# Patient Record
Sex: Male | Born: 1980 | Race: White | Hispanic: No | Marital: Single | Smoking: Current every day smoker
Health system: Southern US, Community
[De-identification: ages and names within clinical notes are randomized; demographics above are authoritative.]

## PROBLEM LIST (undated history)

## (undated) HISTORY — PX: WISDOM TOOTH EXTRACTION: SHX21

---

## 2014-02-20 ENCOUNTER — Ambulatory Visit (INDEPENDENT_AMBULATORY_CARE_PROVIDER_SITE_OTHER): Payer: BLUE CROSS/BLUE SHIELD

## 2014-02-20 ENCOUNTER — Ambulatory Visit (INDEPENDENT_AMBULATORY_CARE_PROVIDER_SITE_OTHER): Payer: BLUE CROSS/BLUE SHIELD | Admitting: Family Medicine

## 2014-02-20 VITALS — BP 128/78 | HR 96 | Temp 98.5°F | Resp 18 | Ht 73.0 in | Wt 180.0 lb

## 2014-02-20 DIAGNOSIS — J329 Chronic sinusitis, unspecified: Secondary | ICD-10-CM

## 2014-02-20 DIAGNOSIS — F17200 Nicotine dependence, unspecified, uncomplicated: Secondary | ICD-10-CM

## 2014-02-20 DIAGNOSIS — Z720 Tobacco use: Secondary | ICD-10-CM

## 2014-02-20 DIAGNOSIS — R0781 Pleurodynia: Secondary | ICD-10-CM

## 2014-02-20 MED ORDER — AZITHROMYCIN 250 MG PO TABS: ORAL_TABLET | ORAL | Status: DC

## 2014-02-20 NOTE — Patient Instructions (Signed)
Drink plenty of fluids to stay well hydrated and keep the mucus thin  Take the azithromycin 2 pills initially, then 1 daily for 4 days  Take OTC ibuprofen 800 mg (4200) maximum of 3 times daily for pain and inflammation of chest wall  Return if worse, particularly if you start running a fever or getting short of breath or having more intense pain

## 2014-02-20 NOTE — Progress Notes (Signed)
Subjective: 34 year old man who has a history of a respiratory infection for the past week or so. He's been coughing a lot. Last night he coughed very intensively to try and bring up some phlegm. Since it has been hurting in his right rib cage. He has been hurting since then. This recommended that he come see the doctor. He does smoke. 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 Moderate amount of cerumen in both ears. Throat clear. Neck supple without significant nodes. Chest clear to auscultation. Heart regular without murmurs. Tenderness right lateral chest wall at about the 8-9th rib anterior axillary line  Assessment: Chest wall pain Bronchitis Tobacco use disorder  Plan: Chest rib films  [facility] reading (PRIMARY) by  Dr. Alwyn RenHopper Normal rib series  Azithromycin and ibuprofen  .

## 2015-12-20 ENCOUNTER — Emergency Department (HOSPITAL_COMMUNITY): Payer: BLUE CROSS/BLUE SHIELD

## 2015-12-20 ENCOUNTER — Emergency Department (HOSPITAL_COMMUNITY)
Admission: EM | Admit: 2015-12-20 | Discharge: 2015-12-20 | Disposition: A | Discharge status: Home / Self Care | Payer: BLUE CROSS/BLUE SHIELD | Attending: Emergency Medicine | Admitting: Emergency Medicine

## 2015-12-20 ENCOUNTER — Encounter (HOSPITAL_COMMUNITY): Payer: Self-pay | Admitting: Emergency Medicine

## 2015-12-20 DIAGNOSIS — J029 Acute pharyngitis, unspecified: Secondary | ICD-10-CM | POA: Diagnosis present

## 2015-12-20 DIAGNOSIS — F17210 Nicotine dependence, cigarettes, uncomplicated: Secondary | ICD-10-CM | POA: Insufficient documentation

## 2015-12-20 DIAGNOSIS — J36 Peritonsillar abscess: Secondary | ICD-10-CM | POA: Insufficient documentation

## 2015-12-20 LAB — BASIC METABOLIC PANEL
Anion gap: 11 (ref 5–15)
BUN: 8 mg/dL (ref 6–20)
CALCIUM: 9.3 mg/dL (ref 8.9–10.3)
CO2: 24 mmol/L (ref 22–32)
CREATININE: 1.04 mg/dL (ref 0.61–1.24)
Chloride: 100 mmol/L — ABNORMAL LOW (ref 101–111)
GFR calc non Af Amer: >60 mL/min (ref >60)
Glucose, Bld: 94 mg/dL (ref 65–99)
Potassium: 3.7 mmol/L (ref 3.5–5.1)
SODIUM: 135 mmol/L (ref 135–145)

## 2015-12-20 LAB — CBC WITH DIFFERENTIAL/PLATELET
Basophils Absolute: 0 10*3/uL (ref 0.0–0.1)
Basophils Relative: 0 %
Eosinophils Absolute: 0 10*3/uL (ref 0.0–0.7)
Eosinophils Relative: 0 %
HCT: 47.4 % (ref 39.0–52.0)
HEMOGLOBIN: 16.2 g/dL (ref 13.0–17.0)
Lymphocytes Relative: 11 %
Lymphs Abs: 1.6 10*3/uL (ref 0.7–4.0)
MCH: 31.2 pg (ref 26.0–34.0)
MCHC: 34.2 g/dL (ref 30.0–36.0)
MCV: 91.2 fL (ref 78.0–100.0)
MONOS PCT: 11 %
Monocytes Absolute: 1.6 10*3/uL — ABNORMAL HIGH (ref 0.1–1.0)
Neutro Abs: 11.6 10*3/uL — ABNORMAL HIGH (ref 1.7–7.7)
Neutrophils Relative %: 78 %
Platelets: 292 10*3/uL (ref 150–400)
RBC: 5.2 MIL/uL (ref 4.22–5.81)
RDW: 13.5 % (ref 11.5–15.5)
WBC: 14.9 10*3/uL — AB (ref 4.0–10.5)

## 2015-12-20 LAB — I-STAT CG4 LACTIC ACID, ED: Lactic Acid, Venous: 0.93 mmol/L (ref 0.5–1.9)

## 2015-12-20 MED ORDER — SODIUM CHLORIDE 0.9 % IV BOLUS (SEPSIS)
500.0000 mL | Freq: Once | INTRAVENOUS | Status: AC
Start: 2015-12-20 — End: 2015-12-20
  Administered 2015-12-20: 500 mL via INTRAVENOUS

## 2015-12-20 MED ORDER — MORPHINE SULFATE (PF) 2 MG/ML IV SOLN
4.0000 mg | Freq: Once | INTRAVENOUS | Status: AC
  Administered 2015-12-20: 4 mg via INTRAVENOUS
  Filled 2015-12-20: qty 2

## 2015-12-20 MED ORDER — DEXAMETHASONE SODIUM PHOSPHATE 10 MG/ML IJ SOLN: 10.0000 mg | Freq: Once | INTRAMUSCULAR | Status: DC

## 2015-12-20 MED ORDER — HYDROCODONE-ACETAMINOPHEN 5-325 MG PO TABS: 1.0000 | ORAL_TABLET | Freq: Four times a day (QID) | ORAL | 0 refills | Status: AC | PRN

## 2015-12-20 MED ORDER — DEXAMETHASONE SODIUM PHOSPHATE 10 MG/ML IJ SOLN
10.0000 mg | Freq: Once | INTRAMUSCULAR | Status: AC
  Administered 2015-12-20: 10 mg via INTRAVENOUS
  Filled 2015-12-20: qty 1

## 2015-12-20 MED ORDER — IOPAMIDOL (ISOVUE-300) INJECTION 61%
100.0000 mL | Freq: Once | INTRAVENOUS | Status: AC | PRN
  Administered 2015-12-20: 100 mL via INTRAVENOUS

## 2015-12-20 MED ORDER — CLINDAMYCIN HCL 150 MG PO CAPS: 300.0000 mg | ORAL_CAPSULE | Freq: Three times a day (TID) | ORAL | 0 refills | Status: AC

## 2015-12-20 MED ORDER — LIDOCAINE-EPINEPHRINE 1 %-1:100000 IJ SOLN
30.0000 mL | Freq: Once | INTRAMUSCULAR | Status: AC
  Administered 2015-12-20: 30 mL via INTRADERMAL
  Filled 2015-12-20: qty 30

## 2015-12-20 NOTE — ED Notes (Signed)
Bed: [id number] Expected date:  Expected time:  Means of arrival:  Comments: tr5

## 2015-12-20 NOTE — Discharge Instructions (Signed)
Please follow-up with Dr. Pollyann Kennedyosen as instructed.Please take your antibiotics 3 times a day for 10 days. You may take the pain medicine as needed. He may also take Motrin as needed.Please return to the ED if your symptoms worsen. Or develop new signs of infection.

## 2015-12-20 NOTE — ED Triage Notes (Signed)
Pt reports sore throat x 5 days, increased difficulty and pain swallowing x 24 hours. Stated that he can swallow water.Seen 3 days ago and sore throat. This morning pt was [facility] by [facility], labs drawn, "antibiotic shot given" and pt was referred to ED for possible abscess in throat. Pt is alert, oriented, and conversive

## 2015-12-20 NOTE — ED Notes (Signed)
Patient transported to CT 

## 2015-12-20 NOTE — ED Provider Notes (Signed)
[facility]-EMERGENCY DEPT Provider Note   CSN: [id number] Arrival date & time: 12/20/15  1109  By signing my name below, I, Rosario AdieWilliam Andrew Hiatt, attest that this documentation has been prepared under the direction and in the presence of Demetrios LollKenneth Leaphart, PA-C.  Electronically Signed: Rosario AdieWilliam Andrew Hiatt, ED Scribe. 12/20/15. 12:16 PM.  History   Chief Complaint Chief Complaint  Patient presents with  . Sore Throat    x 5 days  . Dysphagia    increased over last few days  . Abscess    Sent from PCP to r/o retropharyngial abscess   The history is provided by the patient. No language interpreter was used.   HPI Comments: Jay Christensen is a 35 y.o. male with no pertinent PMHx, who presents to the Emergency Department complaining of gradually worsening sore throat onset approximately 5 days ago. He notes that his pain is more localized to the left-side of his throat. Pt reports associated difficulty swallowing, subjective fever, left ear pain, and muffled voice changes secondary to his sore throat. Pt was seen for same approximately 4 days ago [facility] where they swabbed for strep which was resulted as negative. Pt was again evaluated today by [facility] prior to his arrival in the ED. At that time basic lab work revealed an elevated white count for which he was given both Toradol and Rocephin injections, and subsequent referral into the ED to r/o possible retropharyngeal abscess. He states that after both of these treatments en clinic that his pain and swelling has been mildly alleviated. He has additionally been taking Ibuprofen, using salt water gargles, and Chloraseptic sprays at home with minimal relief of his current symptoms. Pt currently is able to tolerate his own secretions well, however, states that his pain is exacerbated with swallowing. He denies chest pain, shortness of breath, difficulty breathing, or any other associated symptoms.   History reviewed. No  pertinent past medical history.  There are no active problems to display for this patient.  Past Surgical History:  Procedure Laterality Date  . WISDOM TOOTH EXTRACTION      Home Medications    Prior to Admission medications   Medication Sig Start Date End Date Taking? Authorizing Provider  azithromycin (ZITHROMAX) 250 MG tablet Take 2 pills initially, then 1 daily for 4 days 02/20/14   Peyton Najjaravid H Hopper, MD  ibuprofen (ADVIL,MOTRIN) 200 MG tablet Take 200 mg by mouth every 6 (six) hours as needed.    Historical Provider, MD   Family History Family History  Problem Relation Age of Onset  . Hypertension Father   . Hyperlipidemia Father    Social History Social History  Substance Use Topics  . Smoking status: Current Every Day Smoker    Packs/day: 0.50    Years: 12.00    Types: Cigarettes  . Smokeless tobacco: Never Used  . Alcohol use 0.0 oz/week     Comment: weekly   Allergies   Patient has no known allergies.  Review of Systems Review of Systems  Constitutional: Positive for fever (subjective).  HENT: Positive for ear pain (left), sore throat, trouble swallowing and voice change.   Respiratory: Negative for shortness of breath.   Cardiovascular: Negative for chest pain.  All other systems reviewed and are negative.  Physical Exam Updated Vital Signs BP 129/93   Pulse 105   Temp 99.1 F (37.3 C)   Resp 21   Wt 91.7 kg   SpO2 97%   BMI 26.67 kg/m  Physical Exam  Constitutional: He appears well-developed and well-nourished.  Pt is speaking in complete sentences with a muffled voice. Able to manage secretions at this time.   HENT:  Head: Normocephalic and atraumatic.  Right Ear: Tympanic membrane, external ear and ear canal normal.  Left Ear: Tympanic membrane, external ear and ear canal normal.  Nose: Nose normal.  Mouth/Throat: Mucous membranes are normal. There is trismus in the jaw. Posterior oropharyngeal edema, posterior oropharyngeal erythema and  tonsillar abscesses present. No oropharyngeal exudate. Tonsils are 2+ on the right. Tonsils are 4+ on the left. No tonsillar exudate.  Edema noted to left tonsillar region. There is uvular deviation to the right. Significant amount of erythema without exudates. Trismus is present.   Eyes: Conjunctivae are normal.  Neck:  Tender cervical lymphadenopathy on the left. Limited ROM due to pain.   Cardiovascular: Regular rhythm, normal heart sounds and intact distal pulses.  Tachycardia present.   Pulmonary/Chest: Effort normal and breath sounds normal. No respiratory distress. He has no wheezes. He has no rales.  Lungs CTA bilaterally. No hypoxia. Speaking in complete sentences.    Abdominal: He exhibits no distension.  Musculoskeletal: Normal range of motion.  Lymphadenopathy:    He has cervical adenopathy.  Neurological: He is alert.  Skin: Skin is warm and dry. Capillary refill takes less than 2 seconds.  Psychiatric: He has a normal mood and affect. His behavior is normal.  Nursing note and vitals reviewed.  ED Treatments / Results  DIAGNOSTIC STUDIES: Oxygen Saturation is 98% on RA, normal by my interpretation.   COORDINATION OF CARE: 12:16 PM-Discussed next steps with pt. Pt verbalized understanding and is agreeable with the plan.   Labs (all labs ordered are listed, but only abnormal results are displayed) Labs Reviewed  BASIC METABOLIC PANEL  CBC WITH DIFFERENTIAL/PLATELET  I-STAT CG4 LACTIC ACID, ED   Radiology Ct Soft Tissue Neck W Contrast  Result Date: 12/20/2015 CLINICAL DATA:  Worsening sore throat over 4 days, greatest scratch sec greater to the left. Evaluation for retropharyngeal abscess. EXAM: CT NECK WITH CONTRAST TECHNIQUE: Multidetector CT imaging of the neck was performed using the standard protocol following the bolus administration of intravenous contrast. CONTRAST:  100mL ISOVUE-300 IOPAMIDOL (ISOVUE-300) INJECTION 61% COMPARISON:  None. FINDINGS: Pharynx and  larynx: There is prominent asymmetric enlargement of the left palatine tonsil with an associated 2.0 x 1.3 x 3.6 cm low-density collection consistent with abscess. Submucosal edema extends into the posterior and lateral pharynx on the left inferiorly to the level of the hypopharynx with effacement of the left vallecula and left piriform sinus. The uvula is edematous. Mass effect is noted on the airway by the enlarged left tonsil, though the airway remains patent. Inflammatory stranding is present in the left parapharyngeal and posterior left submandibular spaces. No laryngeal airway narrowing. A small amount of retropharyngeal fluid 10 mm in AP thickness at the C4 level. Poor dentition with multiple carious and broken teeth and periapical lucencies. Salivary glands: Parotid and submandibular glands are unremarkable Thyroid: Unremarkable. Lymph nodes: Numerous subcentimeter submental and submandibular lymph nodes. Level II lymph nodes measure up to 10 mm in short axis on the right and 15 mm on the left. Left level III lymph nodes measure up to 8 mm. A mildly enlarged left lateral retropharyngeal lymph node measures 7 mm. Vascular: Major vascular structures of the neck appear patent. Limited intracranial: Unremarkable. Visualized orbits: Unremarkable. Mastoids and visualized paranasal sinuses: Clear. Skeleton: No acute abnormality. Upper chest: Unremarkable. Other:  None. IMPRESSION: 1. Left-sided tonsillitis with 3.6 cm peritonsillar abscess. 2. Small volume retropharyngeal fluid which may reflect effusion or early abscess. 3. Reactive cervical lymphadenopathy. Electronically Signed   By: Sebastian Ache M.D.   On: 12/20/2015 14:28   Procedures Procedures   Medications Ordered in ED Medications  dexamethasone (DECADRON) injection 10 mg (not administered)   Initial Impression / Assessment and Plan / ED Course  I have reviewed the triage vital signs and the nursing notes.  Pertinent labs & imaging results that  were available during my care of the patient were reviewed by me and considered in my medical decision making (see chart for details).  Clinical Course    12:32 PM Pt is a 35yo male refereed in from [place] to r/o retropharyngeal abscess. Labs during his PCP's visit were remarkable for an elevated white count. While in the ED, physical exam reveals dysphagia, left-sided tonsillar edema w/ uvular deviation to the right, cervical lymphadenopathy and trismus. Will obtain repeat labs, give injection of Decadron, and order CT soft tissue neck to evaluate for possible abscess.   1:45 PM Lab work reveals an elevated white count at 14.9, however all other labs, specifically Creatinine and Lactic Acid, are WNL. Will await CT soft tissue.   2:34 PM Pertinent CT imaging is remarkable for Left-sided tonsillitis with 3.6 cm peritonsillar abscess. Small volume retropharyngeal fluid which may reflect effusion or early abscess. Will consult Dr Serena Colonel of ENT.   3:36 PM  Dr. Pollyann Kennedy at bedside who IandD peritonsillar abscess. Patient tolerated procedure well. Dr. Pollyann Kennedy recommends clindamycin for 10 days and small course of pain medicine. He is given referral to see him in the office in one week. Also recommends symptomatically treatment.   The patient is hemodynamically stable. Vital signs within normal limits. Patient tolerated procedure well and feels stable for discharge. He is managing his secretions and able to swallow water. Patient states the pain has mildly improved. Mildly tachycardic likely due to pain. Given bolus of iv fluids and patient able to tolerate po fluids in ED. I have given patient recommendations by Dr. Pollyann Kennedy. Patient was discharged home in no acute distress with stable vital signs. He was given strict return precautions.    Final Clinical Impressions(s) / ED Diagnoses   Final diagnoses:  Peritonsillar abscess   New Prescriptions New Prescriptions   CLINDAMYCIN (CLEOCIN) 150 MG  CAPSULE    Take 2 capsules (300 mg total) by mouth 3 (three) times daily.   HYDROCODONE-ACETAMINOPHEN (NORCO) 5-325 MG TABLET    Take 1-2 tablets by mouth every 6 (six) hours as needed.   I personally performed the services described in this documentation, which was scribed in my presence. The recorded information has been reviewed and is accurate.     Rise Mu, PA-C 12/21/15 1357    Arby Barrette, MD 12/24/15 0930

## 2015-12-20 NOTE — ED Notes (Signed)
ENT MD at bedside.

## 2015-12-20 NOTE — Consult Note (Signed)
Reason for Consult:PTA Referring Physician: Charlesetta Shanks, MD  Jay Christensen is an 35 y.o. male.  HPI: 5 day history of left sided sore throat, getting worse. CT revealed PTA on left. No prior history of tonsil problems.   History reviewed. No pertinent past medical history.  Past Surgical History:  Procedure Laterality Date  . WISDOM TOOTH EXTRACTION      Family History  Problem Relation Age of Onset  . Hypertension Father   . Hyperlipidemia Father     Social History:  reports that he has been smoking Cigarettes.  He has a 6.00 pack-year smoking history. He has never used smokeless tobacco. He reports that he drinks alcohol. He reports that he does not use drugs.  Allergies: No Known Allergies  Medications: Reviewed  Results for orders placed or performed during the hospital encounter of 12/20/15 (from the past 48 hour(s))  Basic metabolic panel     Status: Abnormal   Collection Time: 12/20/15 12:36 PM  Result Value Ref Range   Sodium 135 135 - 145 mmol/L   Potassium 3.7 3.5 - 5.1 mmol/L   Chloride 100 (L) 101 - 111 mmol/L   CO2 24 22 - 32 mmol/L   Glucose, Bld 94 65 - 99 mg/dL   BUN 8 6 - 20 mg/dL   Creatinine, Ser 1.04 0.61 - 1.24 mg/dL   Calcium 9.3 8.9 - 10.3 mg/dL   GFR calc non Af Amer >60 >60 mL/min   GFR calc Af Amer >60 >60 mL/min    Comment: (NOTE) The eGFR has been calculated using the CKD EPI equation. This calculation has not been validated in all clinical situations. eGFR's persistently <60 mL/min signify possible Chronic Kidney Disease.    Anion gap 11 5 - 15  CBC with Differential     Status: Abnormal   Collection Time: 12/20/15 12:36 PM  Result Value Ref Range   WBC 14.9 (H) 4.0 - 10.5 K/uL   RBC 5.20 4.22 - 5.81 MIL/uL   Hemoglobin 16.2 13.0 - 17.0 g/dL   HCT 47.4 39.0 - 52.0 %   MCV 91.2 78.0 - 100.0 fL   MCH 31.2 26.0 - 34.0 pg   MCHC 34.2 30.0 - 36.0 g/dL   RDW 13.5 11.5 - 15.5 %   Platelets 292 150 - 400 K/uL   Neutrophils Relative %  78 %   Neutro Abs 11.6 (H) 1.7 - 7.7 K/uL   Lymphocytes Relative 11 %   Lymphs Abs 1.6 0.7 - 4.0 K/uL   Monocytes Relative 11 %   Monocytes Absolute 1.6 (H) 0.1 - 1.0 K/uL   Eosinophils Relative 0 %   Eosinophils Absolute 0.0 0.0 - 0.7 K/uL   Basophils Relative 0 %   Basophils Absolute 0.0 0.0 - 0.1 K/uL  I-Stat CG4 Lactic Acid, ED     Status: None   Collection Time: 12/20/15 12:46 PM  Result Value Ref Range   Lactic Acid, Venous 0.93 0.5 - 1.9 mmol/L    Ct Soft Tissue Neck W Contrast  Result Date: 12/20/2015 CLINICAL DATA:  Worsening sore throat over 4 days, greatest scratch sec greater to the left. Evaluation for retropharyngeal abscess. EXAM: CT NECK WITH CONTRAST TECHNIQUE: Multidetector CT imaging of the neck was performed using the standard protocol following the bolus administration of intravenous contrast. CONTRAST:  117m ISOVUE-300 IOPAMIDOL (ISOVUE-300) INJECTION 61% COMPARISON:  None. FINDINGS: Pharynx and larynx: There is prominent asymmetric enlargement of the left palatine tonsil with an associated 2.0 x 1.3 x  3.6 cm low-density collection consistent with abscess. Submucosal edema extends into the posterior and lateral pharynx on the left inferiorly to the level of the hypopharynx with effacement of the left vallecula and left piriform sinus. The uvula is edematous. Mass effect is noted on the airway by the enlarged left tonsil, though the airway remains patent. Inflammatory stranding is present in the left parapharyngeal and posterior left submandibular spaces. No laryngeal airway narrowing. A small amount of retropharyngeal fluid 10 mm in AP thickness at the C4 level. Poor dentition with multiple carious and broken teeth and periapical lucencies. Salivary glands: Parotid and submandibular glands are unremarkable Thyroid: Unremarkable. Lymph nodes: Numerous subcentimeter submental and submandibular lymph nodes. Level II lymph nodes measure up to 10 mm in short axis on the right and  15 mm on the left. Left level III lymph nodes measure up to 8 mm. A mildly enlarged left lateral retropharyngeal lymph node measures 7 mm. Vascular: Major vascular structures of the neck appear patent. Limited intracranial: Unremarkable. Visualized orbits: Unremarkable. Mastoids and visualized paranasal sinuses: Clear. Skeleton: No acute abnormality. Upper chest: Unremarkable. Other: None. IMPRESSION: 1. Left-sided tonsillitis with 3.6 cm peritonsillar abscess. 2. Small volume retropharyngeal fluid which may reflect effusion or early abscess. 3. Reactive cervical lymphadenopathy. Electronically Signed   By: Logan Bores M.D.   On: 12/20/2015 14:28    KGY:JEHUDJSH except as listed in admit H&P  Blood pressure 126/87, pulse 115, temperature 99.1 F (37.3 C), resp. rate (!) 28, weight 91.7 kg (202 lb 2 oz), SpO2 97 %.  PHYSICAL EXAM: Overall appearance:  Healthy appearing, in no distress, muffled voice.  Head:  Normocephalic, atraumatic. Ears: External ears look healthy. Nose: External nose is healthy in appearance. Internal nasal exam free of any lesions or obstruction. Oral Cavity/Pharynx:  Asymmetric swelling of left soft palate and tonsil. Tender to palpation. Mild trismus. Larynx/Hypopharynx: Deferred Neuro:  No identifiable neurologic deficits. Neck: Tender left sided level 2 nodes.  Studies Reviewed: CT neck.  Procedures: I&D Left peritonsillar abscess.  1% xylocaine with epi infiltrated into the left side soft palate. 18 G needle was used to aspirate about 2 cc pus. 15 scalped used to incise the soft palate mucosa and tonsil hemostat used to open the abscess widely. He tolerated the procedure well.    Assessment/Plan: S/P I&D left PTA, clindamycin for 10 days. Follow up one week. Stop smoking. Call if any worse over the next 24-48 hours.  Jay Christensen 12/20/2015, 3:13 PM

## 2018-06-03 IMAGING — CT CT NECK W/ CM
4 of 5 series · 16 of 33 positions shown, 18 images · IV contrast (iopamidol)
Comparison: None.

CLINICAL DATA: Worsening sore throat over 4 days, greatest scratch
sec greater to the left. Evaluation for retropharyngeal abscess.

EXAM:
CT NECK WITH CONTRAST
TECHNIQUE: Multidetector CT imaging of the neck was performed using the
standard protocol following the bolus administration of intravenous
contrast.
CONTRAST:  100mL U4Y6OX-VJJ IOPAMIDOL (U4Y6OX-VJJ) INJECTION 61%

[Series 2: neck with st · axial · 0.53mm/px · z∈[+1212,+1336]mm · 4 of 104 slices shown, 5 images]
[im 21/104  soft-tissue]
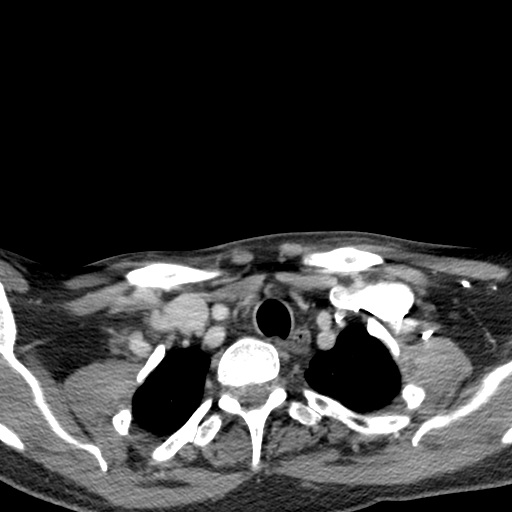
[im 21/104  bone]
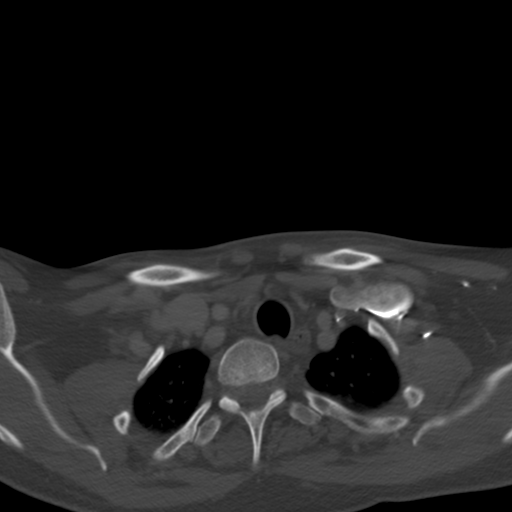
[im 42/104  bone]
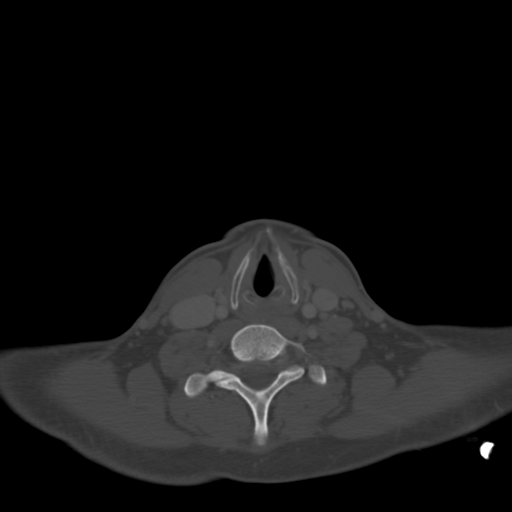
[im 62/104  bone]
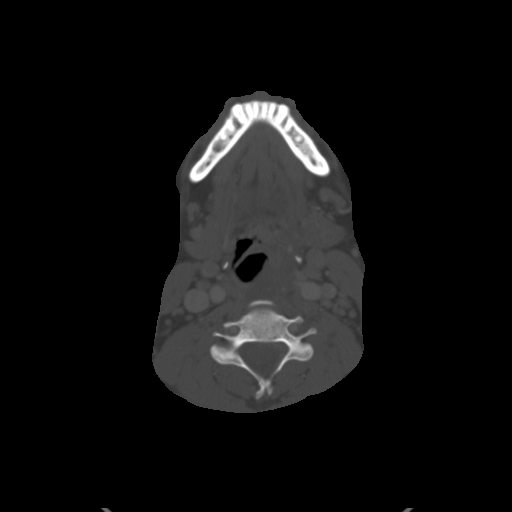
[im 83/104  bone]
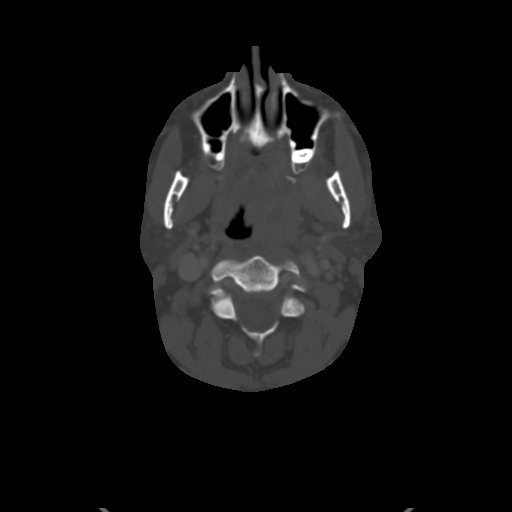

[Series 3: coronal st · coronal · 0.45mm/px · 3 of 101 slices shown]
[im 21/101  bone]
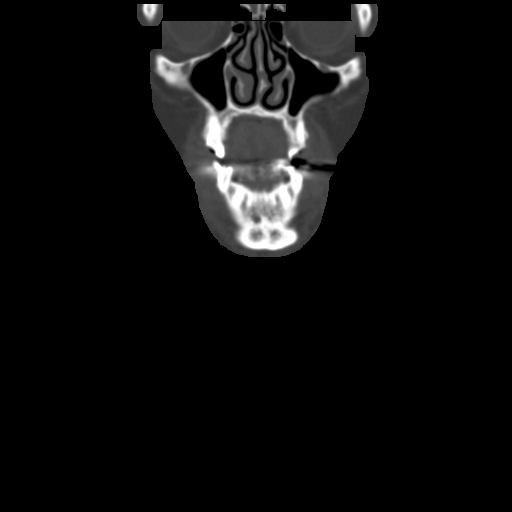
[im 41/101  bone]
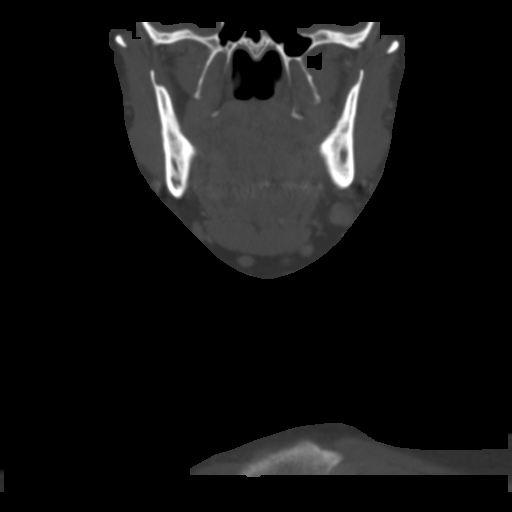
[im 61/101  bone]
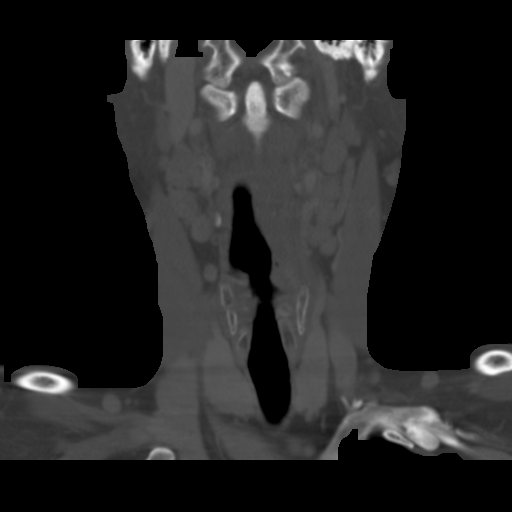

[Series 4: sagittal st · sagittal · 0.45mm/px · 5 of 101 slices shown, 6 images]
[im 34/101  bone]
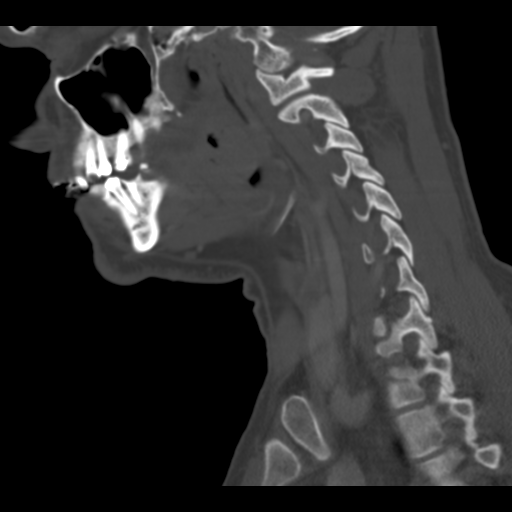
[im 42/101  bone]
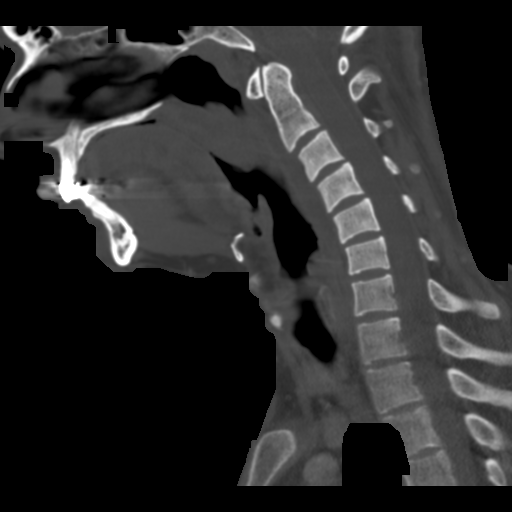
[im 51/101  soft-tissue]
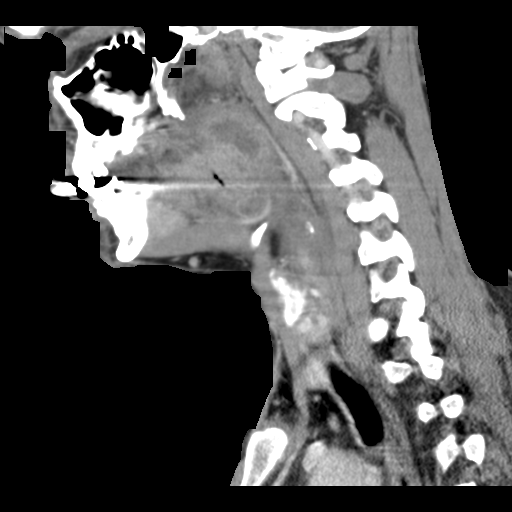
[im 51/101  bone]
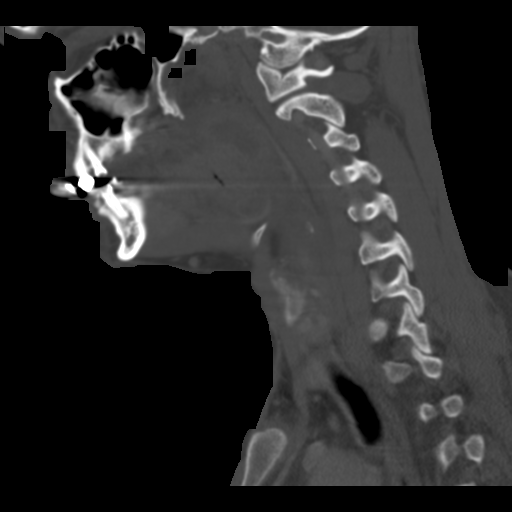
[im 59/101  bone]
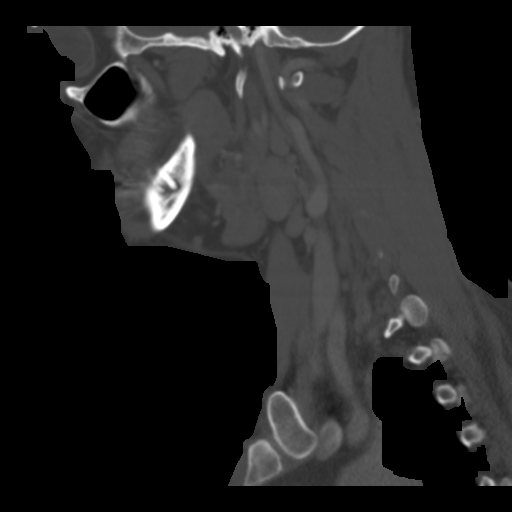
[im 67/101  bone]
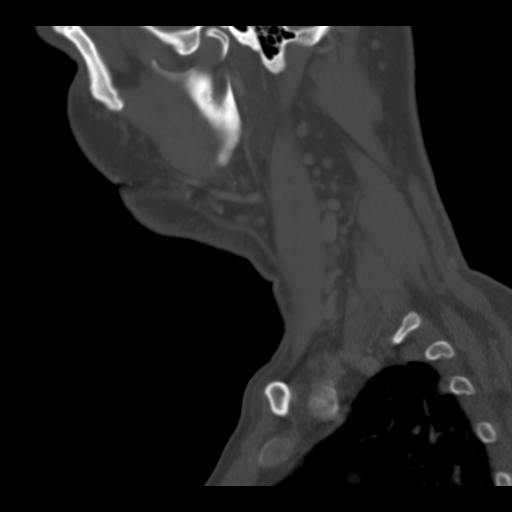

[Series 5: ax axial recons · axial · 0.39mm/px · z∈[+1187,+1306]mm · 4 of 103 slices shown]
[im 21/103  bone]
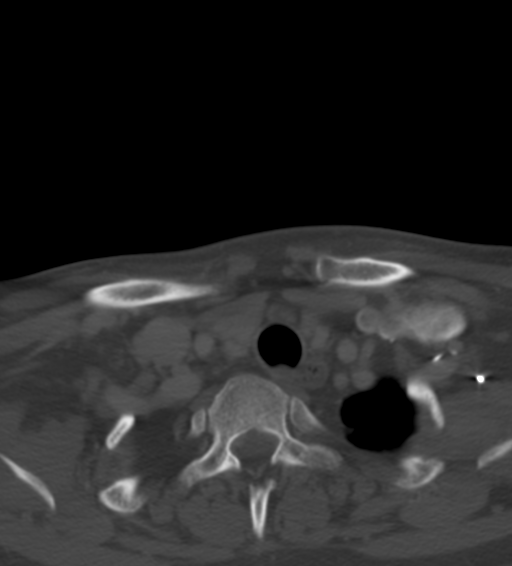
[im 41/103  bone]
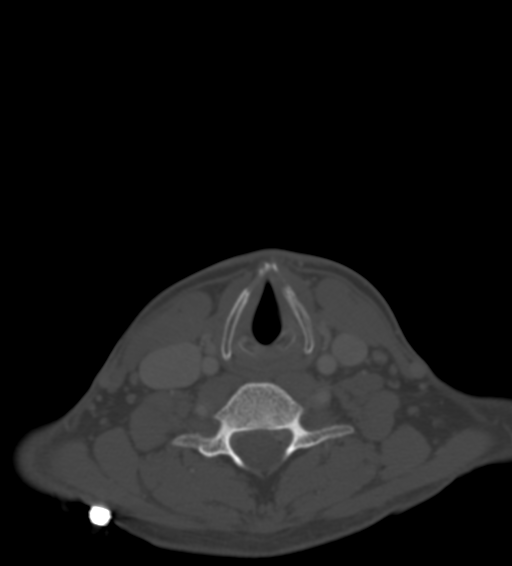
[im 62/103  bone]
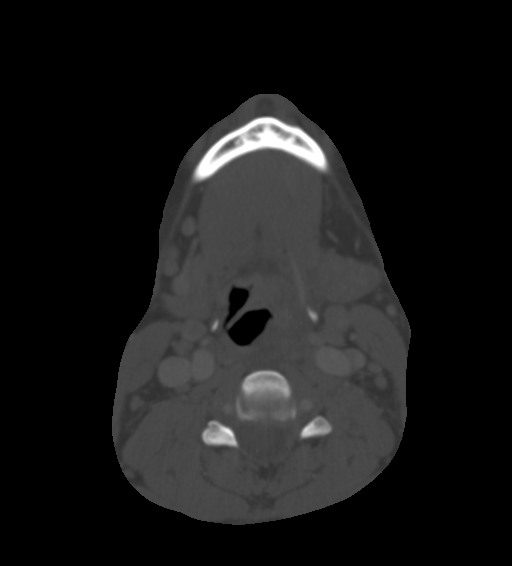
[im 82/103  bone]
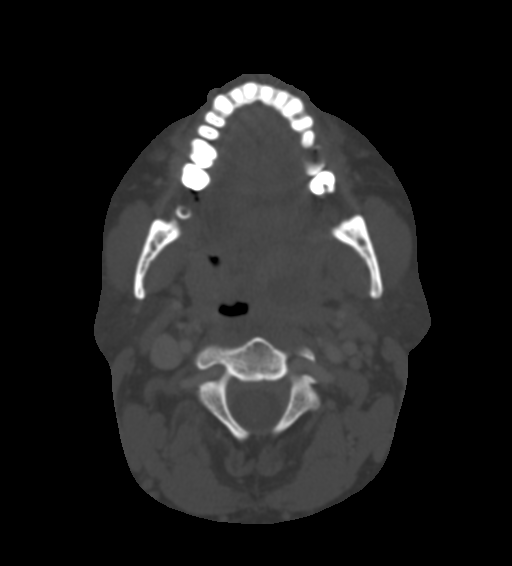

[16 of 33 positions shown; findings below may reference images not displayed]

FINDINGS: Pharynx and larynx: There is prominent asymmetric enlargement of the
left palatine tonsil with an associated 2.0 x 1.3 x 3.6 cm
low-density collection consistent with abscess. Submucosal edema
extends into the posterior and lateral pharynx on the left
inferiorly to the level of the hypopharynx with effacement of the
left vallecula and left piriform sinus. The uvula is edematous. Mass
effect is noted on the airway by the enlarged left tonsil, though
the airway remains patent. Inflammatory stranding is present in the
left parapharyngeal and posterior left submandibular spaces. No
laryngeal airway narrowing. A small amount of retropharyngeal fluid
10 mm in AP thickness at the C4 level. Poor dentition with multiple
carious and broken teeth and periapical lucencies.

Salivary glands: Parotid and submandibular glands are unremarkable

Thyroid: Unremarkable.

Lymph nodes: Numerous subcentimeter submental and submandibular
lymph nodes. Level II lymph nodes measure up to 10 mm in short axis
on the right and 15 mm on the left. Left level III lymph nodes
measure up to 8 mm. A mildly enlarged left lateral retropharyngeal
lymph node measures 7 mm.

Vascular: Major vascular structures of the neck appear patent.

Limited intracranial: Unremarkable.

Visualized orbits: Unremarkable.

Mastoids and visualized paranasal sinuses: Clear.

Skeleton: No acute abnormality.

Upper chest: Unremarkable.

Other: None.
IMPRESSION: 1. Left-sided tonsillitis with 3.6 cm peritonsillar abscess.
2. Small volume retropharyngeal fluid which may reflect effusion or
early abscess.
3. Reactive cervical lymphadenopathy.

## 2020-12-29 DIAGNOSIS — Z0000 Encounter for general adult medical examination without abnormal findings: Secondary | ICD-10-CM | POA: Diagnosis not present

## 2020-12-29 DIAGNOSIS — Z13220 Encounter for screening for lipoid disorders: Secondary | ICD-10-CM | POA: Diagnosis not present

## 2020-12-29 DIAGNOSIS — F411 Generalized anxiety disorder: Secondary | ICD-10-CM | POA: Diagnosis not present

## 2021-02-07 ENCOUNTER — Encounter (HOSPITAL_COMMUNITY): Payer: Self-pay

## 2021-02-07 ENCOUNTER — Emergency Department (HOSPITAL_COMMUNITY)
Admission: EM | Admit: 2021-02-07 | Discharge: 2021-02-07 | Disposition: A | Discharge status: Home / Self Care | Payer: BC Managed Care – PPO | Attending: Emergency Medicine | Admitting: Emergency Medicine

## 2021-02-07 DIAGNOSIS — F17210 Nicotine dependence, cigarettes, uncomplicated: Secondary | ICD-10-CM | POA: Diagnosis not present

## 2021-02-07 DIAGNOSIS — S01511A Laceration without foreign body of lip, initial encounter: Secondary | ICD-10-CM | POA: Insufficient documentation

## 2021-02-07 DIAGNOSIS — S0993XA Unspecified injury of face, initial encounter: Secondary | ICD-10-CM | POA: Diagnosis not present

## 2021-02-07 DIAGNOSIS — W108XXA Fall (on) (from) other stairs and steps, initial encounter: Secondary | ICD-10-CM | POA: Diagnosis not present

## 2021-02-07 MED ORDER — LIDOCAINE HCL (PF) 1 % IJ SOLN
10.0000 mL | Freq: Once | INTRAMUSCULAR | Status: AC
  Administered 2021-02-07: 04:00:00 10 mL
  Filled 2021-02-07: qty 30

## 2021-02-07 MED ORDER — LIDOCAINE-EPINEPHRINE-TETRACAINE (LET) TOPICAL GEL
3.0000 mL | Freq: Once | TOPICAL | Status: AC
  Administered 2021-02-07: 04:00:00 3 mL via TOPICAL
  Filled 2021-02-07: qty 3

## 2021-02-07 NOTE — ED Provider Notes (Signed)
[facility] DEPT Provider Note   CSN: [id number] Arrival date & time: 02/07/21  0216     History Chief Complaint  Patient presents with   Laceration    Jay Christensen is a 40 y.o. male with a history of tobacco use who presents to the emergency department for evaluation of lower lip laceration which occurred shortly prior to arrival.  Patient states he tripped while going up some steps landing and cutting his lip.  He denies loss of consciousness.  No other areas of injury. Lip is somewhat painful, no alleviating/aggravating factors. Last tetanus within past 10 years. Did have a few alcoholic beverages tonight, denies drug use.  Denies change in vision, numbness, weakness, seizure activity, syncope, vomiting, neck pain, back pain, chest pain, or abdominal pain.    HPI     History reviewed. No pertinent past medical history.  There are no problems to display for this patient.   Past Surgical History:  Procedure Laterality Date   WISDOM TOOTH EXTRACTION         Family History  Problem Relation Age of Onset   Hypertension Father    Hyperlipidemia Father     Social History   Tobacco Use   Smoking status: Every Day    Packs/day: 0.50    Years: 12.00    Pack years: 6.00    Types: Cigarettes   Smokeless tobacco: Never  Substance Use Topics   Alcohol use: Yes    Alcohol/week: 0.0 standard drinks    Comment: weekly   Drug use: No    Home Medications Prior to Admission medications   Medication Sig Start Date End Date Taking? Authorizing Provider  clindamycin (CLEOCIN) 150 MG capsule Take 2 capsules (300 mg total) by mouth 3 (three) times daily. 12/20/15   Doristine Devoid, PA-C  HYDROcodone-acetaminophen (NORCO) 5-325 MG tablet Take 1-2 tablets by mouth every 6 (six) hours as needed. 12/20/15   Doristine Devoid, PA-C  ibuprofen (ADVIL,MOTRIN) 200 MG tablet Take 400 mg by mouth every 6 (six) hours as needed for mild pain.      [provider]    Allergies    Patient has no known allergies.  Review of Systems   Review of Systems  Constitutional:  Negative for fever.  Eyes:  Negative for visual disturbance.  Respiratory:  Negative for shortness of breath.   Cardiovascular:  Negative for chest pain.  Gastrointestinal:  Negative for abdominal pain and vomiting.  Skin:  Positive for wound.  Neurological:  Negative for seizures, syncope, weakness and numbness.  All other systems reviewed and are negative.  Physical Exam Updated Vital Signs BP (!) 145/108    Pulse 97    Resp 16    Ht 6\' 2"  (1.88 m)    Wt 104.3 kg    SpO2 100%    BMI 29.53 kg/m   Physical Exam Vitals and nursing note reviewed.  Constitutional:      General: He is not in acute distress.    Appearance: He is well-developed. He is not toxic-appearing.  HENT:     Head: Normocephalic. No raccoon eyes or Battle's sign.     Right Ear: No hemotympanum.     Left Ear: No hemotympanum.     Nose:     Comments: Dried blood to the anterior naris.  No septal hematoma.      Mouth/Throat:     Comments: Patient has a 4.5 cm avulsion type/flap laceration to the left lower lip  which involves the vermilion border and extends to the internal mucous membranes.  There is no active bleeding, no visible foreign bodies.  Mild swelling to the left lower lip noted.  No significant tenderness to palpation.  No focal bony tenderness to palpation.  Patient has poor dentition throughout but no palpable dental instability. Eyes:     General:        Right eye: No discharge.        Left eye: No discharge.     Extraocular Movements: Extraocular movements intact.     Conjunctiva/sclera: Conjunctivae normal.     Pupils: Pupils are equal, round, and reactive to light.  Cardiovascular:     Rate and Rhythm: Normal rate and regular rhythm.  Pulmonary:     Effort: Pulmonary effort is normal. No respiratory distress.     Breath sounds: Normal breath sounds. No wheezing,  rhonchi or rales.  Chest:     Chest wall: No tenderness.  Abdominal:     General: There is no distension.     Palpations: Abdomen is soft.     Tenderness: There is no abdominal tenderness.  Musculoskeletal:     Cervical back: Neck supple. No spinous process tenderness.     Comments: Moving all extremities no focal bony tenderness.  No midline spinal tenderness.  Skin:    General: Skin is warm and dry.     Findings: No rash.  Neurological:     Mental Status: He is alert.     Comments: Clear speech.   Psychiatric:        Behavior: Behavior normal.    ED Results / Procedures / Treatments   Labs (all labs ordered are listed, but only abnormal results are displayed) Labs Reviewed - No data to display  EKG None  Radiology No results found.  Procedures .Marland KitchenLaceration Repair  Date/Time: 02/07/2021 5:00 AM Performed by: Cherly Anderson, PA-C Authorized by: Cherly Anderson, PA-C   Consent:    Consent obtained:  Verbal   Consent given by:  Patient   Risks, benefits, and alternatives were discussed: yes     Risks discussed:  Infection, need for additional repair, nerve damage, poor wound healing, poor cosmetic result, pain, retained foreign body, tendon damage and vascular damage   Alternatives discussed:  No treatment Laceration details:    Location:  Lip   Lip location: lower lip.   Length (cm):  4.5   Depth (mm):  4 Pre-procedure details:    Preparation:  Patient was prepped and draped in usual sterile fashion Exploration:    Hemostasis achieved with:  Direct pressure   Contaminated: no   Treatment:    Amount of cleaning:  Standard   Irrigation solution:  Sterile water   Irrigation method:  Syringe Skin repair:    Repair method:  Sutures   Suture size:  5-0   Wound skin closure material used: vicryl rapide.   Suture technique:  Simple interrupted   Number of sutures:  12 Approximation:    Approximation:  Close   Vermilion border well-aligned: yes    Repair type:    Repair type:  Simple Post-procedure details:    Procedure completion:  Tolerated well, no immediate complications   Medications Ordered in ED Medications  lidocaine-EPINEPHrine-tetracaine (LET) topical gel (3 mLs Topical Given 02/07/21 0347)  lidocaine (PF) (XYLOCAINE) 1 % injection 10 mL (10 mLs Infiltration Given 02/07/21 0347)    ED Course  I have reviewed the triage vital signs and the nursing  notes.  Pertinent labs & imaging results that were available during my care of the patient were reviewed by me and considered in my medical decision making (see chart for details).    MDM Rules/Calculators/A&P                           Patient presents to the emergency department for evaluation of lower lip laceration which occurred shortly prior to arrival.  He is nontoxic, resting comfortably, his initial elevated blood pressure normalized with repeat vitals.  Appears to have isolated injury to the lower lip-wound was irrigated, visualized in a bloodless field, and without evidence of foreign body.  Repaired per procedure note above with absorbable sutures.  Tolerated well.  His tetanus is up-to-date.  He has had some alcohol tonight with facial trauma, we discussed potential of further imaging which he would like to decline and I feel is reasonable.  He is alert, oriented, and has no focal neurologic deficits.  He has no focal areas of tenderness to palpation throughout.  He is ambulatory in the emergency department.  Appears reasonable for discharge. I discussed treatment plan including wound care, need for follow-up, and return precautions with the patient. Provided opportunity for questions, patient confirmed understanding and is in agreement with plan.    Final Clinical Impression(s) / ED Diagnoses Final diagnoses:  Lip laceration, initial encounter    Rx / DC Orders ED Discharge Orders     None        Amaryllis Dyke, PA-C [id number]    Delora Fuel, MD [id number] [phone]

## 2021-02-07 NOTE — Discharge Instructions (Signed)
You were seen in the emergency department today for a laceration. Your laceration was closed with 12 absorbable stitches. Please try to keep this area clean and dry for the next 24 hours, after 24 hours you may get this area wet, but avoid soaking the area, be sure to dry thoroughly after getting wet. Keep the area covered as best possible especially when in the sun to help in minimizing scarring.   See attached wound care instructions.   Please have the wound recheck your primary care provider within 1 week.  The stitches will come out on their own.  Return to the ER soon should you start to experience pus type drainage from the wound, redness around the wound, or fevers as this could indicate the area is infected, additionally given that you hit your face/head return to the emergency department immediately for any confusion, new headache, severe headache, change in your vision, numbness, weakness, trouble walking, dizziness, vomiting, seizure activity, or any other concerns.

## 2021-02-07 NOTE — ED Notes (Signed)
Reviewed d/c instructions with pt. Pt verbalized understanding. Pt ambulatory on d/c.

## 2021-02-07 NOTE — ED Notes (Signed)
Sam, PA at bedside for lip lac repair.

## 2021-02-07 NOTE — ED Triage Notes (Signed)
Pt states at 0200 he was walking up the stairs, tripped and busted his lower lip. Pt is UTD on tetanus. Pt endorses 4-5 ETOH drinks tonight. Pt is applying pressure.

## 2021-03-17 DIAGNOSIS — J029 Acute pharyngitis, unspecified: Secondary | ICD-10-CM | POA: Diagnosis not present

## 2021-12-31 DIAGNOSIS — Z23 Encounter for immunization: Secondary | ICD-10-CM | POA: Diagnosis not present

## 2021-12-31 DIAGNOSIS — E782 Mixed hyperlipidemia: Secondary | ICD-10-CM | POA: Diagnosis not present

## 2021-12-31 DIAGNOSIS — Z0000 Encounter for general adult medical examination without abnormal findings: Secondary | ICD-10-CM | POA: Diagnosis not present
